# Patient Record
Sex: Female | Born: 2000 | Hispanic: No | Marital: Single | Smoking: Never smoker
Health system: Southern US, Community
[De-identification: ages and names within clinical notes are randomized; demographics above are authoritative.]

---
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
## 2021-02-23 ENCOUNTER — Other Ambulatory Visit: Payer: Self-pay | Admitting: Gastroenterology

## 2021-02-23 DIAGNOSIS — T189XXD Foreign body of alimentary tract, part unspecified, subsequent encounter: Secondary | ICD-10-CM

## 2021-02-23 DIAGNOSIS — T189XXA Foreign body of alimentary tract, part unspecified, initial encounter: Secondary | ICD-10-CM

## 2022-02-08 ENCOUNTER — Encounter: Payer: Self-pay | Admitting: Internal Medicine

## 2022-02-08 ENCOUNTER — Other Ambulatory Visit: Payer: Self-pay

## 2022-02-08 ENCOUNTER — Ambulatory Visit (INDEPENDENT_AMBULATORY_CARE_PROVIDER_SITE_OTHER): Payer: Medicaid Other | Admitting: Internal Medicine

## 2022-02-08 ENCOUNTER — Other Ambulatory Visit (HOSPITAL_COMMUNITY)
Admission: RE | Admit: 2022-02-08 | Discharge: 2022-02-08 | Disposition: A | Discharge status: Home / Self Care | Payer: Medicaid Other | Source: Ambulatory Visit | Attending: Internal Medicine | Admitting: Internal Medicine

## 2022-02-08 VITALS — BP 100/68 | HR 74 | Temp 97.9°F | Ht 62.5 in | Wt 107.1 lb

## 2022-02-08 DIAGNOSIS — Z13220 Encounter for screening for lipoid disorders: Secondary | ICD-10-CM | POA: Diagnosis not present

## 2022-02-08 DIAGNOSIS — Z202 Contact with and (suspected) exposure to infections with a predominantly sexual mode of transmission: Secondary | ICD-10-CM

## 2022-02-08 DIAGNOSIS — Z113 Encounter for screening for infections with a predominantly sexual mode of transmission: Secondary | ICD-10-CM | POA: Insufficient documentation

## 2022-02-08 DIAGNOSIS — Z0000 Encounter for general adult medical examination without abnormal findings: Secondary | ICD-10-CM | POA: Insufficient documentation
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
## 2022-02-08 NOTE — Assessment & Plan Note (Signed)
Pt is here to establish care. No acute concerns today.  -Labs: HIV and lipid profile

## 2022-02-08 NOTE — Patient Instructions (Signed)
Thank you, Gina Montes for allowing [place] to provide your care today. Today we discussed your overall health.  I am obtaining labs today and will contact you with these results.    I have ordered the following labs for you:  Lab Orders         HIV antibody (with reflex)         RPR         Lipid Profile     + Self-swab labs  Referrals ordered today:   Referral Orders  No referral(s) requested today     I have ordered the following medication/changed the following medications:   Stop the following medications: There are no discontinued medications.   Start the following medications: No orders of the defined types were placed in this encounter.    Follow up: 1 year    Should you have any questions or concerns please call the internal medicine clinic at [phone].

## 2022-02-08 NOTE — Progress Notes (Signed)
   CC: establishment of care  HPI:  [name] is a 20 y.o. with past medical history as noted below who presents to the clinic today for the establishment of care. Please see problem-based list for further details, assessments, and plans.   No past medical history on file.  Family history: Diabetes in paternal grandfather and father with prediabetes. No cancers or heart attacks.  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 Vapes "every so often throughout the day," about 2 years, no alcohol use, no other drug use currently.    Review of Systems: Negative aside from that listed in individualized problem based charting.   Physical Exam:  Vitals:   02/08/22 1040  BP: 100/68  Pulse: 74  Temp: 97.9 F (36.6 C)  TempSrc: Oral  SpO2: 100%  Weight: 107 lb 1.6 oz (48.6 kg)  Height: 5' 2.5" (1.588 m)   General: NAD, nl appearance HE: Normocephalic, atraumatic, EOMI, Conjunctivae normal ENT: No congestion, no rhinorrhea, no exudate or erythema  Cardiovascular: Normal rate, regular rhythm. No murmurs, rubs, or gallops Pulmonary: Effort normal, breath sounds normal. No wheezes, rales, or rhonchi Abdominal: soft, nontender, bowel sounds present Musculoskeletal: no swelling, deformity, injury or tenderness in extremities Skin: Warm, dry, no bruising, erythema, or rash Psychiatric/Behavioral: normal mood, normal behavior     Assessment & Plan:   See Encounters Tab for problem based charting.  Patient discussed with Dr. Dareen Piano

## 2022-02-09 LAB — CERVICOVAGINAL ANCILLARY ONLY
Bacterial Vaginitis (gardnerella): POSITIVE — AB
Candida Glabrata: NEGATIVE
Candida Vaginitis: POSITIVE — AB
Chlamydia: NEGATIVE
Comment: NEGATIVE
Comment: NEGATIVE
Comment: NEGATIVE
Comment: NEGATIVE
Comment: NEGATIVE
Comment: NORMAL
Neisseria Gonorrhea: NEGATIVE
Trichomonas: NEGATIVE

## 2022-02-09 LAB — RPR: RPR Ser Ql: NONREACTIVE

## 2022-02-09 LAB — LIPID PANEL
Chol/HDL Ratio: 1.9 ratio (ref 0.0–4.4)
Cholesterol, Total: 159 mg/dL (ref 100–199)
HDL: 82 mg/dL (ref >39)
LDL Chol Calc (NIH): 65 mg/dL (ref 0–99)
Triglycerides: 59 mg/dL (ref 0–149)
VLDL Cholesterol Cal: 12 mg/dL (ref 5–40)

## 2022-02-09 LAB — HIV ANTIBODY (ROUTINE TESTING W REFLEX): HIV Screen 4th Generation wRfx: NONREACTIVE

## 2022-02-09 NOTE — Progress Notes (Signed)
Internal Medicine Clinic Attending ° °Case discussed with Dr. Bonanno  °  At the time of the visit.  We reviewed the resident’s history and exam and pertinent patient test results.  I agree with the assessment, diagnosis, and plan of care documented in the resident’s note. ° °

## 2022-02-12 ENCOUNTER — Other Ambulatory Visit: Payer: Self-pay | Admitting: Internal Medicine

## 2022-02-12 MED ORDER — FLUCONAZOLE 150 MG PO TABS: ORAL_TABLET | ORAL | 0 refills | Status: DC

## 2022-02-12 MED ORDER — METRONIDAZOLE 500 MG PO TABS: 500.0000 mg | ORAL_TABLET | Freq: Two times a day (BID) | ORAL | 0 refills | Status: AC

## 2022-02-12 NOTE — Addendum Note (Signed)
Addended by: Orvis Brill on: 02/12/2022 02:41 PM   Modules accepted: Orders

## 2022-05-05 ENCOUNTER — Emergency Department: Payer: Medicaid Other

## 2022-05-05 ENCOUNTER — Other Ambulatory Visit: Payer: Self-pay

## 2022-05-05 ENCOUNTER — Emergency Department
Admission: EM | Admit: 2022-05-05 | Discharge: 2022-05-05 | Disposition: A | Discharge status: Home / Self Care | Payer: Medicaid Other | Attending: Emergency Medicine | Admitting: Emergency Medicine

## 2022-05-05 DIAGNOSIS — M542 Cervicalgia: Secondary | ICD-10-CM | POA: Diagnosis not present

## 2022-05-05 DIAGNOSIS — S161XXA Strain of muscle, fascia and tendon at neck level, initial encounter: Secondary | ICD-10-CM | POA: Diagnosis not present

## 2022-05-05 DIAGNOSIS — M545 Low back pain, unspecified: Secondary | ICD-10-CM | POA: Diagnosis not present

## 2022-05-05 DIAGNOSIS — S39012A Strain of muscle, fascia and tendon of lower back, initial encounter: Secondary | ICD-10-CM | POA: Diagnosis not present

## 2022-05-05 DIAGNOSIS — Y92410 Unspecified street and highway as the place of occurrence of the external cause: Secondary | ICD-10-CM | POA: Insufficient documentation

## 2022-05-05 DIAGNOSIS — S199XXA Unspecified injury of neck, initial encounter: Secondary | ICD-10-CM | POA: Diagnosis not present

## 2022-05-05 DIAGNOSIS — M546 Pain in thoracic spine: Secondary | ICD-10-CM | POA: Diagnosis not present

## 2022-05-05 LAB — POC URINE PREG, ED: Preg Test, Ur: NEGATIVE

## 2022-05-05 MED ORDER — MELOXICAM 15 MG PO TABS: 15.0000 mg | ORAL_TABLET | Freq: Every day | ORAL | 0 refills | Status: AC

## 2022-05-05 MED ORDER — METHOCARBAMOL 500 MG PO TABS: 500.0000 mg | ORAL_TABLET | Freq: Four times a day (QID) | ORAL | 0 refills | Status: DC

## 2022-05-05 NOTE — ED Notes (Addendum)
Pt in a c-collar on arrival. Dr. Jacqualine Code EDP cleared for triage.
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
## 2022-05-05 NOTE — ED Triage Notes (Signed)
See first nurse note.  Pt awake and alert with c-collar in place. Pt is A&Ox4. No LOC noted.

## 2022-05-05 NOTE — ED Notes (Signed)
C-collar removed by Betha Loa PA-C. Patient was given gown and instructed to undress. Family at bedside.
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
## 2024-02-10 ENCOUNTER — Other Ambulatory Visit (HOSPITAL_COMMUNITY)
Admission: RE | Admit: 2024-02-10 | Discharge: 2024-02-10 | Disposition: A | Discharge status: Home / Self Care | Source: Ambulatory Visit | Attending: Internal Medicine | Admitting: Internal Medicine

## 2024-02-10 ENCOUNTER — Ambulatory Visit: Payer: Self-pay | Admitting: Student

## 2024-02-10 VITALS — BP 119/65 | HR 68 | Temp 98.3°F | Ht 62.0 in | Wt 121.0 lb

## 2024-02-10 DIAGNOSIS — Z113 Encounter for screening for infections with a predominantly sexual mode of transmission: Secondary | ICD-10-CM

## 2024-02-10 DIAGNOSIS — Z3202 Encounter for pregnancy test, result negative: Secondary | ICD-10-CM

## 2024-02-10 DIAGNOSIS — L739 Follicular disorder, unspecified: Secondary | ICD-10-CM

## 2024-02-10 DIAGNOSIS — R195 Other fecal abnormalities: Secondary | ICD-10-CM | POA: Diagnosis not present

## 2024-02-10 LAB — POCT URINE PREGNANCY: Preg Test, Ur: NEGATIVE

## 2024-02-10 NOTE — Patient Instructions (Signed)
 Thank you, Ms.Lanisha [place] for allowing us  to provide your care today. Today we discussed STI screening, birth control options, abdominal rash, and loose stools.   I have ordered the following labs for you:  Lab Orders         POCT Urine Pregnancy      Tests ordered today:  Cervicovaginal swab   Referrals ordered today:   Referral Orders  No referral(s) requested today     I have ordered the following medication/changed the following medications:   Stop the following medications: There are no discontinued medications.   Start the following medications: No orders of the defined types were placed in this encounter.    Follow up: 1 year or earlier as needed, can call back clinic to follow up on today's visit if things change   Remember:   - I will call you with the results of your urine pregnancy test and vaginal swab. I will send over prescriptions if needed. Please remember that abstinence or condoms/barrier protection are ways to prevent sexually transmitted infections. If you become interested in starting any birth control please call our clinic back and we can discuss your options further including pill, injection, implant, IUD, or ring.   - Your rash is likely due to shaving. Folliculitis is an inflammation of the hair follicles. It is easy for bacteria from the skin to enter into the hair follicle. Your is a mild case which we can treat by avoiding shaving (use Nair or wax instead once the rash improves/isn't raised), washing with soap and water, avoiding any harsh products with fragrances, and using sunblock if out in the sun to avoid any darkening of the spots (we call this darker skin post inflammatory hyperpigmentation). If you notice any worsening or no improvement despite avoiding shaving we can send in a topical antibiotic to your pharmacy that you can use for 5-7 days.   Should you have any questions or concerns please call the internal medicine clinic at  [phone].     Ryle Buscemi Arellano Zameza, MD PGY-1 Internal Medicine Teaching Progam [facility] Center

## 2024-02-10 NOTE — Progress Notes (Signed)
 Established Patient Office Visit  Subjective   Patient ID: Gina Montes, female    DOB: 09-29-2000  Age: 23 y.o. MRN: [id number]  Chief Complaint  Patient presents with   Rash    Patient is a 23 y.o. with a past medical history stated below who presents today for annual physical and she'd like to get help with a new rash on her abdomen. Her last visit to [facility] was on 02/08/2022. Please see problem based assessment and plan for additional details.      No past medical history on file.    Review of Systems  Constitutional:  Negative for fever.  Skin:  Positive for rash.     Objective:     BP 119/65 (BP Location: Right Arm, Patient Position: Sitting, Cuff Size: Small)   Pulse 68   Temp 98.3 F (36.8 C) (Oral)   Ht 5' 2 (1.575 m)   Wt 121 lb (54.9 kg)   SpO2 100%   BMI 22.13 kg/m  Wt Readings from Last 3 Encounters:  02/10/24 121 lb (54.9 kg)  02/08/22 107 lb 1.6 oz (48.6 kg)  12/29/19 120 lb (54.4 kg) (41%, Z= -0.23)*   * Growth percentiles are based on CDC (Girls, 2-20 Years) data.      Physical Exam HENT:     Head: Normocephalic and atraumatic.   Cardiovascular:     Rate and Rhythm: Normal rate and regular rhythm.     Pulses: Normal pulses.     Heart sounds: Normal heart sounds.  Pulmonary:     Effort: Pulmonary effort is normal.     Breath sounds: Normal breath sounds.  Abdominal:     General: Abdomen is flat.     Palpations: Abdomen is soft.     Tenderness: There is no abdominal tenderness.   Musculoskeletal:        General: Normal range of motion.   Skin:    General: Skin is warm.     Comments: Please see image below for photo of rash on abdomen. Affected area is widespread across upper abdomen, looks to have small erythematous papules with some surrounding post inflammatory hyperpigmentation. Surrounding skin appears to have been recently shaved.    Neurological:     General: No focal deficit present.     Mental Status: She is alert.    Psychiatric:        Mood and Affect: Mood normal.      Results for orders placed or performed in visit on 02/10/24  POCT Urine Pregnancy  Result Value Ref Range   Preg Test, Ur Negative Negative  Cervicovaginal ancillary only  Result Value Ref Range   Neisseria Gonorrhea Negative    Chlamydia Negative    Trichomonas Negative    Bacterial Vaginitis (gardnerella) Negative    Candida Vaginitis Negative    Candida Glabrata Negative    Comment      Normal Reference Range Bacterial Vaginosis - Negative   Comment Normal Reference Range Candida Species - Negative    Comment Normal Reference Range Candida Galbrata - Negative    Comment Normal Reference Range Trichomonas - Negative    Comment Normal Reference Ranger Chlamydia - Negative    Comment      Normal Reference Range Neisseria Gonorrhea - Negative    Last hemoglobin A1c No results found for: HGBA1C    The ASCVD Risk score (Arnett DK, et al., 2019) failed to calculate for the following reasons:   The 2019 ASCVD risk score is  only valid for ages 50 to 38    Assessment & Plan:   Problem List Items Addressed This Visit     Routine screening for STI (sexually transmitted infection) - Primary   Patient with new intimate partner. Not on any formal birth control, uses condoms and pull out method mostly. Has good insight to risks of these methods for birth control and to stay safe from sexually transmitted infections. Last menstrual period a month ago, normal. No symptoms of dysuria, discharge, itch, odor, or pain but would like to get pregnancy test and STI screening. Counseled patient on various birth control methods, states she is not interested in any at the moment but knows she can request more information or an appointment to start birth control at any time.  Plan - Cervicovaginal swab for STI screening- negative today, no further treatment needed - Urine pregnancy test- negative today       Relevant Orders    Cervicovaginal ancillary only (Completed)   POCT Urine Pregnancy (Completed)   Folliculitis   Patient states her rash started a couple of days ago on her abdomen. Skin seems dry and patchy but denies any pain, pruritus, discharge, or bleeding. No new lotions, clothing, or laundry detergent. Denies any fever, nausea, or vomiting. No sick contacts but does endorse recent loose stools that started about 2 weeks ago. Has felt well overall. On exam her rash appears to be consistent with folliculitis as it is over recently shaved skin and appears to only affect areas where the follicle is visible. Discussed conservative management including cleaning the affected area with mild soap and water, drying it well, wearing looser cotton clothing, changing out of sweaty clothing, and avoiding further shaving. Patient tried this for 4 days then called back the clinic to say there was minimal improvement and she is concerned a larger area is involved. Patient agreeable to applying topical antibiotic for 5-7 days and calling back if there is any worsening for further assessment and treatment.  Plan - Avoid shaving, wash/dry area with mild soap and water, apply thin coat of clindamycin 1% gel twice daily for 5-7 days - Avoid sun exposure to area to help with post inflammatory hyperpigmentation, will need to wear sunscreen when affected area improves      Loose stools   Patient with 1-2 loose stools a day for last 2 weeks. States they can be foul smelling, appear to have a normal color. Bowel movements don't seem to be any worse with certain foods. Eats a regular diet. Denies sick contacts, fever, nausea, vomiting, abdominal pain, or weight loss. Low suspicion for bacterial gastroenteritis. More likely to be mild viral infection or could be due to lactose intolerance. Patient does not seem to be too bothered by loose stools, recommended over the counter imodium. Can call clinic if episodes become more frequent or she  develops additional symptoms.       Patient discussed with Dr. Lelia Putnam.  Return as needed, for rash, loose stools, or to discuss birth control methods.   Gina Lofquist Arellano Zameza, MD PGY-1 [facility]

## 2024-02-11 ENCOUNTER — Ambulatory Visit: Payer: Self-pay | Admitting: Student

## 2024-02-11 LAB — CERVICOVAGINAL ANCILLARY ONLY
Bacterial Vaginitis (gardnerella): NEGATIVE
Candida Glabrata: NEGATIVE
Candida Vaginitis: NEGATIVE
Chlamydia: NEGATIVE
Comment: NEGATIVE
Comment: NEGATIVE
Comment: NEGATIVE
Comment: NEGATIVE
Comment: NEGATIVE
Comment: NORMAL
Neisseria Gonorrhea: NEGATIVE
Trichomonas: NEGATIVE

## 2024-02-13 ENCOUNTER — Telehealth: Payer: Self-pay | Admitting: *Deleted

## 2024-02-13 ENCOUNTER — Other Ambulatory Visit: Payer: Self-pay | Admitting: Student

## 2024-02-13 DIAGNOSIS — L739 Follicular disorder, unspecified: Secondary | ICD-10-CM | POA: Insufficient documentation

## 2024-02-13 DIAGNOSIS — R195 Other fecal abnormalities: Secondary | ICD-10-CM | POA: Insufficient documentation

## 2024-02-13 MED ORDER — CLINDAMYCIN PHOS (ONCE-DAILY) 1 % EX GEL: 1.0000 | Freq: Two times a day (BID) | CUTANEOUS | 0 refills | Status: DC

## 2024-02-13 NOTE — Telephone Encounter (Signed)
 Copied from CRM [id number]. Topic: General - Other >> Feb 13, 2024  2:27 PM Carrielelia G wrote: Reason for CRM: Patient was instructed by provider that if she wanted a topical RX  for her rash to please call. Patient said she would like it please.  Please advise

## 2024-02-13 NOTE — Assessment & Plan Note (Signed)
 Patient with new intimate partner. Not on any formal birth control, uses condoms and pull out method mostly. Has good insight to risks of these methods for birth control and to stay safe from sexually transmitted infections. Last menstrual period a month ago, normal. No symptoms of dysuria, discharge, itch, odor, or pain but would like to get pregnancy test and STI screening. Counseled patient on various birth control methods, states she is not interested in any at the moment but knows she can request more information or an appointment to start birth control at any time.  Plan - Cervicovaginal swab for STI screening- negative today, no further treatment needed - Urine pregnancy test- negative today

## 2024-02-13 NOTE — Assessment & Plan Note (Signed)
 Patient states her rash started a couple of days ago on her abdomen. Skin seems dry and patchy but denies any pain, pruritus, discharge, or bleeding. No new lotions, clothing, or laundry detergent. Denies any fever, nausea, or vomiting. No sick contacts but does endorse recent loose stools that started about 2 weeks ago. Has felt well overall. On exam her rash appears to be consistent with folliculitis as it is over recently shaved skin and appears to only affect areas where the follicle is visible. Discussed conservative management including cleaning the affected area with mild soap and water, drying it well, wearing looser cotton clothing, changing out of sweaty clothing, and avoiding further shaving. Patient tried this for 4 days then called back the clinic to say there was minimal improvement and she is concerned a larger area is involved. Patient agreeable to applying topical antibiotic for 5-7 days and calling back if there is any worsening for further assessment and treatment.  Plan - Avoid shaving, wash/dry area with mild soap and water, apply thin coat of clindamycin 1% gel twice daily for 5-7 days - Avoid sun exposure to area to help with post inflammatory hyperpigmentation, will need to wear sunscreen when affected area improves

## 2024-02-13 NOTE — Assessment & Plan Note (Signed)
 Patient with 1-2 loose stools a day for last 2 weeks. States they can be foul smelling, appear to have a normal color. Bowel movements don't seem to be any worse with certain foods. Eats a regular diet. Denies sick contacts, fever, nausea, vomiting, abdominal pain, or weight loss. Low suspicion for bacterial gastroenteritis. More likely to be mild viral infection or could be due to lactose intolerance. Patient does not seem to be too bothered by loose stools, recommended over the counter imodium. Can call clinic if episodes become more frequent or she develops additional symptoms.

## 2024-02-14 NOTE — Progress Notes (Signed)
 Internal Medicine Clinic Attending  Case discussed with the resident at the time of the visit.  We reviewed the resident's history and exam and pertinent patient test results.  I agree with the assessment, diagnosis, and plan of care documented in the resident's note.

## 2024-04-22 DIAGNOSIS — B9689 Other specified bacterial agents as the cause of diseases classified elsewhere: Secondary | ICD-10-CM | POA: Diagnosis not present

## 2024-04-22 DIAGNOSIS — R21 Rash and other nonspecific skin eruption: Secondary | ICD-10-CM | POA: Diagnosis not present

## 2024-04-22 DIAGNOSIS — N760 Acute vaginitis: Secondary | ICD-10-CM | POA: Diagnosis not present

## 2024-05-04 ENCOUNTER — Telehealth: Payer: Self-pay | Admitting: Student

## 2024-05-04 DIAGNOSIS — B3731 Acute candidiasis of vulva and vagina: Secondary | ICD-10-CM | POA: Diagnosis not present

## 2024-05-04 DIAGNOSIS — N898 Other specified noninflammatory disorders of vagina: Secondary | ICD-10-CM | POA: Diagnosis not present

## 2024-05-04 NOTE — Telephone Encounter (Signed)
 Spoke with the patient who was seen by another office already.  Copied from CRM [id number]. Topic: Appointments - Scheduling Inquiry for Clinic >> May 04, 2024 11:04 AM Alfonso ORN wrote: Reason for CRM: patient need a ER followup , having abnormal discharge that is green and have some itching some discomfort going on for about 2 weeks  was given a medication call that is not helping  Requesting an appointment today or tomorrow    05/04/24 or 05/05/24    ----------------------------------------------------------------------- From previous Reason for Contact - Scheduling: Patient/patient representative is calling to schedule an appointment. Refer to attachments for appointment information.

## 2024-07-09 ENCOUNTER — Ambulatory Visit

## 2024-07-09 ENCOUNTER — Ambulatory Visit: Payer: Self-pay

## 2024-07-09 ENCOUNTER — Other Ambulatory Visit (HOSPITAL_COMMUNITY): Admission: RE | Admit: 2024-07-09 | Discharge: 2024-07-09 | Disposition: A | Source: Ambulatory Visit

## 2024-07-09 VITALS — BP 108/63 | HR 68 | Temp 98.1°F | Ht 62.0 in | Wt 131.0 lb

## 2024-07-09 DIAGNOSIS — F17210 Nicotine dependence, cigarettes, uncomplicated: Secondary | ICD-10-CM | POA: Diagnosis not present

## 2024-07-09 DIAGNOSIS — Z8619 Personal history of other infectious and parasitic diseases: Secondary | ICD-10-CM | POA: Diagnosis not present

## 2024-07-09 DIAGNOSIS — F17290 Nicotine dependence, other tobacco product, uncomplicated: Secondary | ICD-10-CM

## 2024-07-09 DIAGNOSIS — Z124 Encounter for screening for malignant neoplasm of cervix: Secondary | ICD-10-CM | POA: Insufficient documentation

## 2024-07-09 DIAGNOSIS — A64 Unspecified sexually transmitted disease: Secondary | ICD-10-CM | POA: Insufficient documentation

## 2024-07-09 DIAGNOSIS — Z8742 Personal history of other diseases of the female genital tract: Secondary | ICD-10-CM

## 2024-07-09 DIAGNOSIS — R300 Dysuria: Secondary | ICD-10-CM | POA: Diagnosis not present

## 2024-07-09 MED ORDER — BORIC ACID 600 MG VA SUPP: 600.0000 mg | Freq: Every day | VAGINAL | 0 refills | Status: DC

## 2024-07-09 NOTE — Progress Notes (Signed)
 CC: Vaginal discharge, acute visit  HPI:  [name] is a 23 y.o. female with pertinent past medical history stated below and presents today for vaginal discharge, acute visit. Please see problem based assessment and plan for additional details.  Last clinic appointment: 02/10/2024 for routine screening of STI.  Was not interested in birth control at the moment.  Was negative for trichomonas, yeast, chlamydia, gonorrhea, syphilis, pregnancy  Last Pertinent Labs Documented:      No data to display              No data to display          No results found for: HGBA1C   No past medical history on file.  Current Outpatient Medications on File Prior to Visit  Medication Sig Dispense Refill   amitriptyline  (ELAVIL ) 25 MG tablet Take 1 tablet nightly p.o. (Patient not taking: Reported on 12/29/2019) 30 tablet 3   aspirin-acetaminophen -caffeine (EXCEDRIN MIGRAINE) 250-250-65 MG tablet Take 1-2 tablets by mouth every 6 (six) hours as needed for headache (or pain).     Clindamycin  Phos, Once-Daily, (CLINDAGEL) 1 % GEL Apply 1 Application topically in the morning and at bedtime. Apply thin layer to affected area after cleaning skin with soap and water twice daily for 5-7 days. 75 mL 0   fluconazole  (DIFLUCAN ) 150 MG tablet TAKE FIRST TABLET ON DAY 1, TAKE SECOND TABLET ON DAY 4. (Patient not taking: Reported on 05/05/2022) 2 tablet 0   methocarbamol  (ROBAXIN ) 500 MG tablet Take 1 tablet (500 mg total) by mouth 4 (four) times daily. 16 tablet 0   No current facility-administered medications on file prior to visit.    Family History  Problem Relation Age of Onset   Migraines Maternal Grandmother    Seizures Neg Hx    Autism Neg Hx    ADD / ADHD Neg Hx    Anxiety disorder Neg Hx    Depression Neg Hx    Bipolar disorder Neg Hx    Schizophrenia Neg Hx     Social History   Socioeconomic History   Marital status: Single    Spouse name: Not on file   Number of children: Not  on file   Years of education: Not on file   Highest education level: Not on file  Occupational History   Not on file  Tobacco Use   Smoking status: Never   Smokeless tobacco: Never  Substance and Sexual Activity   Alcohol use: Not on file   Drug use: Not on file   Sexual activity: Not on file  Other Topics Concern   Not on file  Social History Narrative   Lives with mom, dad and siblings. She is in the 11th grade at [employer].    Social Drivers of Corporate Investment Banker Strain: Not on file  Food Insecurity: Not on file  Transportation Needs: Not on file  Physical Activity: Not on file  Stress: Not on file  Social Connections: Not on file  Intimate Partner Violence: Not on file    Review of Systems: As stated below  Vitals:   07/09/24 1432  BP: 108/63  Pulse: 68  Temp: 98.1 F (36.7 C)  TempSrc: Oral  SpO2: 100%  Weight: 131 lb (59.4 kg)  Height: 5' 2 (1.575 m)    Physical Exam: Physical Exam Exam conducted with a chaperone present.  Constitutional:      General: She is not in acute distress.    Appearance: She  is normal weight. She is not ill-appearing, toxic-appearing or diaphoretic.  Pulmonary:     Effort: Pulmonary effort is normal.  Genitourinary:    General: Normal vulva.     Pubic Area: Rash present.     Labia:        Right: No rash, tenderness or lesion.        Left: No rash, tenderness or lesion.      Vagina: No signs of injury. Vaginal discharge (Thick, white) present. No tenderness or lesions.     Cervix: Normal. No friability or erythema.      Comments: excoriations present Skin:    General: Skin is warm and dry.  Neurological:     Mental Status: She is alert.  Psychiatric:        Mood and Affect: Mood normal.        Behavior: Behavior normal.      Assessment & Plan:   Patient seen with Dr. Shawn Assessment & Plan STI (sexually transmitted infection) Patient presents with symptoms of dysuria, discharge, vaginal itching, odor,  vaginal pain.  Stated that in August, she thought she had a yeast infection so she went to the urgent care however they diagnosed her with BV and she completed treatment of metronidazole ; patient additionally did Monistat OTC, however her symptoms did not resolve.  With continued symptoms, she went to [facility] who diagnosed her with a yeast infection and prescribed Diflucan  which the patient required both pills and still did not have resolution of symptoms.  She went back to [facility] where they tested for specific strain of candidal infection.  By this point vaginal discharge changed from creamy white to green.  They prescribed 2 creams for outside and intravaginal medicine.  After she completed this treatment, she was symptom-free for about a week.  By mid October she started experiencing symptoms of a UTI (see below) and had the return of increased vaginal discharge.  At this point, patient said vaginal discharge was alternating between creamy, thick, white and watery, white, with foul odor of spoiled milk or bleach.  As of this appointment, patient stated that there is no foul odor with her discharge but her vulva is inflamed/swollen.  She is on the last day of her menstrual period.  This past week she has not used tampons, only pads.  Her sexual history is as follows:  Number of current sexual partners: 1 (same partner from last clinic visit where she tested negative for STIs) Sexual orientation: heterosexual  Contraceptive: not on contraceptives. She has discussed with her partner and he will possibly get a vasectomy.  Has no personal history of blood clots or migraines with aura.  She currently smokes occasionally (vaping/cigarettes).  Counseled patient on options for birth control including IUD, shot, birth control pills, implant. Not interested in contraceptives at this time.   Condoms: Not using condoms Pregnancy intention: not opposed to getting pregnant Safety: Patient  stated that she feels safe in her current relationship  Patient's partner did not get treated for BV when she resulted positive at the urgent care.  It is likely that she has been reinfected with BV and positive for candidal infection.  On physical exam there were excoriations on external GU.  Vaginal canal appeared normal, cervix appeared normal.  Discharge was present (thick, white).  Will treat with boric acid vaginal suppositories for 14 days to cover candidal infection and BV while awaiting for other results.  See below for the remainder of the plan.  -  Cervicovaginal ancillary swab collected today - Instructed patient to have partner tested and treated based on previous positive BV results from urgent care on 04/22/2024 to prevent reinfection - Counseled patient's on importance of abstinence/use of condoms to prevent re-infection before completing/partner completing treatment. - Prescribed boric acid vaginal suppositories for 14 days. Patient counseled on how to take this medication  - Instructed to apply over-the-counter hydrocortisone cream over the area of itchiness twice a day as needed - Instructed patient to continue keeping the area of itchiness clean and to apply Aquaphor only after she has applied her hydrocortisone for good penetration of the topical Cervical cancer screening Patient has never had a Pap smear done before.  Explained importance of Pap smear for screening of cervical cancers.  Patient stated understanding and willingness to have Pap smear done along with vaginal swab. -Cytology Pap smear ordered with reflex to HPV and testing for herpes 1 and 2 Dysuria Patient has been complaining of off-and-on dysuria, frequency, suprapubic pressure since mid October 2025.  Stated that she has never had a UTI before, but her roommate gets them frequently.  The symptoms are roommate described where things that she was feeling.  She did not go get treatment as the symptoms would wax and  wane.  Her last symptoms of frequency, dysuria, suprapubic pressure occurred 2 days ago.  Will obtain a urine specimen for urinalysis, however will not treat if patient is asymptomatic at time of results.  Explained the above to the patient who stated understanding. -UA ordered   Orders Placed This Encounter  Procedures   Urinalysis, Reflex Microscopic     Maston Wight, D.O. [facility], PGY-1 Date 07/09/2024 Time 5:24 PM

## 2024-07-09 NOTE — Telephone Encounter (Addendum)
 FYI Only or Action Required?: FYI only for provider: appointment scheduled on 07/09/24.  Patient was last seen in primary care on 02/10/2024 by Arellano Zameza, Priscila, MD.  Called Nurse Triage reporting Dysuria and Vaginitis.  Symptoms began several months ago.  Interventions attempted: Prescription medications: monistat, BV treatment.  Symptoms are: unchanged.  Triage Disposition: See Physician Within 24 Hours  Patient/caregiver understands and will follow disposition?: Yes        Copied from CRM [id number]. Topic: Clinical - Red Word Triage >> Jul 09, 2024  9:52 AM Mercer PEDLAR wrote: Red Word that prompted transfer to Nurse Triage: burning with urination, Irritation, swelling, and odor. Reason for Disposition  Unusual vaginal discharge (e.g., bad smelling, yellow, green, or foamy-white)  Answer Assessment - Initial Assessment Questions 1. SEVERITY: How bad is the pain?  (e.g., Scale 1-10; mild, moderate, or severe)     Uncomfortable 2. FREQUENCY: How many times have you had painful urination today?      *No Answer* 3. PATTERN: Is pain present every time you urinate or just sometimes?      sometimes 4. ONSET: When did the painful urination start?      A while - a few months 5. FEVER: Do you have a fever? If Yes, ask: What is your temperature, how was it measured, and when did it start?     denies 6. PAST UTI: Have you had a urine infection before? If Yes, ask: When was the last time? and What happened that time?      denies 7. CAUSE: What do you think is causing the painful urination?  (e.g., UTI, scratch, Herpes sore)     UTI 8. OTHER SYMPTOMS: Do you have any other symptoms? (e.g., blood in urine, flank pain, genital sores, urgency, vaginal discharge)     Odor, swelling 9. PREGNANCY: Is there any chance you are pregnant? When was your last menstrual period?     *No Answer*  Protocols used: Urination Pain - Female-A-AH

## 2024-07-09 NOTE — Telephone Encounter (Signed)
 Pt has an appt with Dr Benuel today @ 1415PM.

## 2024-07-09 NOTE — Patient Instructions (Addendum)
 Today we discussed the following medical conditions and plan:   - Have your partner go to the doctor and get treated for BV (recommendation is oral metronidazole  (400 mg twice daily for 7 days)).  - ill message/call you with the results of the pap smear, STI testing, and urinalysis  - We are sending in for boric acid vaginal suppositories -- you'll insert ONE tablet INTRAVAGINALLY once daily for 14 days. DO NOT EAT/SWALLOW - apply over-the-counter hydrocortisone cream over the area of itchiness twice a day as needed - continue keeping the area of itchiness clean and to apply Aquaphor only after she has applied her hydrocortisone for good penetration of the topical - abstain from sex/use condoms until we have the results of the testing AND until your partner has been treated.   We look forward to seeing you next time. Please call our clinic at [phone] if you have any questions or concerns. The best time to call is Monday-Friday from 9am-4pm, but there is someone available 24/7. If you need medication refills, please notify your pharmacy one week in advance and they will send us  a request.   Thank you for trusting me with your care. Wishing you the best!   Sallyanne Primas, DO  [facility]

## 2024-07-10 ENCOUNTER — Ambulatory Visit: Payer: Self-pay

## 2024-07-10 LAB — CERVICOVAGINAL ANCILLARY ONLY
Bacterial Vaginitis (gardnerella): POSITIVE — AB
Candida Glabrata: NEGATIVE
Candida Vaginitis: POSITIVE — AB
Chlamydia: NEGATIVE
Comment: NEGATIVE
Comment: NEGATIVE
Comment: NEGATIVE
Comment: NEGATIVE
Comment: NEGATIVE
Comment: NORMAL
Neisseria Gonorrhea: NEGATIVE
Trichomonas: NEGATIVE

## 2024-07-10 LAB — MICROSCOPIC EXAMINATION: Casts: NONE SEEN /LPF

## 2024-07-10 LAB — URINALYSIS, ROUTINE W REFLEX MICROSCOPIC
Bilirubin, UA: NEGATIVE
Glucose, UA: NEGATIVE
Ketones, UA: NEGATIVE
Nitrite, UA: NEGATIVE
Protein,UA: NEGATIVE
RBC, UA: NEGATIVE
Specific Gravity, UA: 1.029 (ref 1.005–1.030)
Urobilinogen, Ur: 0.2 mg/dL (ref 0.2–1.0)
pH, UA: 5.5 (ref 5.0–7.5)

## 2024-07-10 MED ORDER — METRONIDAZOLE 500 MG PO TABS: 500.0000 mg | ORAL_TABLET | Freq: Two times a day (BID) | ORAL | 0 refills | Status: AC

## 2024-07-10 MED ORDER — FLUCONAZOLE 150 MG PO TABS: 150.0000 mg | ORAL_TABLET | Freq: Every day | ORAL | 0 refills | Status: DC

## 2024-07-10 NOTE — Telephone Encounter (Signed)
 Spoke with patient on phone regarding STI testing results. Patient stated symptoms have improved with boric acid. She had no further questions or concerns. I placed instructions within mychart message for prescriptions sent to her preferred pharmacy.

## 2024-07-10 NOTE — Addendum Note (Signed)
 Addended by: SHAWN SICK on: 07/10/2024 08:52 AM   Modules accepted: Level of Service

## 2024-07-10 NOTE — Progress Notes (Signed)
 Internal Medicine Clinic Attending  I was physically present during the key portions of the resident provided service and participated in the medical decision making of patient's management care. I reviewed pertinent patient test results.  The assessment, diagnosis, and plan were formulated together and I agree with the documentation in the resident's note.  Shawn Sick, MD

## 2024-07-15 LAB — CYTOLOGY - PAP
Comment: NEGATIVE
Diagnosis: NEGATIVE
HSV1: NEGATIVE
HSV2: NEGATIVE

## 2024-08-06 ENCOUNTER — Emergency Department (HOSPITAL_COMMUNITY): Admission: EM | Admit: 2024-08-06 | Discharge: 2024-08-06 | Disposition: A | Discharge status: Home / Self Care

## 2024-08-06 ENCOUNTER — Other Ambulatory Visit: Payer: Self-pay

## 2024-08-06 ENCOUNTER — Encounter (HOSPITAL_COMMUNITY): Payer: Self-pay

## 2024-08-06 DIAGNOSIS — R109 Unspecified abdominal pain: Secondary | ICD-10-CM | POA: Diagnosis not present

## 2024-08-06 DIAGNOSIS — K625 Hemorrhage of anus and rectum: Secondary | ICD-10-CM | POA: Insufficient documentation

## 2024-08-06 LAB — CBC WITH DIFFERENTIAL/PLATELET
Abs Immature Granulocytes: 0.01 K/uL (ref 0.00–0.07)
Basophils Absolute: 0 K/uL (ref 0.0–0.1)
Basophils Relative: 0 %
Eosinophils Absolute: 0 K/uL (ref 0.0–0.5)
Eosinophils Relative: 0 %
HCT: 39 % (ref 36.0–46.0)
Hemoglobin: 13 g/dL (ref 12.0–15.0)
Immature Granulocytes: 0 %
Lymphocytes Relative: 24 %
Lymphs Abs: 1.1 K/uL (ref 0.7–4.0)
MCH: 27.5 pg (ref 26.0–34.0)
MCHC: 33.3 g/dL (ref 30.0–36.0)
MCV: 82.6 fL (ref 80.0–100.0)
Monocytes Absolute: 0.6 K/uL (ref 0.1–1.0)
Monocytes Relative: 13 %
Neutro Abs: 2.8 K/uL (ref 1.7–7.7)
Neutrophils Relative %: 63 %
Platelets: 200 K/uL (ref 150–400)
RBC: 4.72 MIL/uL (ref 3.87–5.11)
RDW: 15 % (ref 11.5–15.5)
WBC: 4.5 K/uL (ref 4.0–10.5)
nRBC: 0 % (ref 0.0–0.2)

## 2024-08-06 LAB — BASIC METABOLIC PANEL WITH GFR
Anion gap: 9 (ref 5–15)
BUN: 14 mg/dL (ref 6–20)
CO2: 25 mmol/L (ref 22–32)
Calcium: 9.1 mg/dL (ref 8.9–10.3)
Chloride: 101 mmol/L (ref 98–111)
Creatinine, Ser: 0.51 mg/dL (ref 0.44–1.00)
GFR, Estimated: >60 mL/min (ref >60)
Glucose, Bld: 87 mg/dL (ref 70–99)
Potassium: 3.9 mmol/L (ref 3.5–5.1)
Sodium: 135 mmol/L (ref 135–145)

## 2024-08-06 LAB — URINALYSIS, ROUTINE W REFLEX MICROSCOPIC
Bacteria, UA: NONE SEEN
Bilirubin Urine: NEGATIVE
Glucose, UA: NEGATIVE mg/dL
Hgb urine dipstick: NEGATIVE
Ketones, ur: NEGATIVE mg/dL
Leukocytes,Ua: NEGATIVE
Nitrite: NEGATIVE
Protein, ur: NEGATIVE mg/dL
Specific Gravity, Urine: 1.02 (ref 1.005–1.030)
pH: 7 (ref 5.0–8.0)

## 2024-08-06 LAB — PREGNANCY, URINE: Preg Test, Ur: NEGATIVE

## 2024-08-06 NOTE — ED Triage Notes (Addendum)
 Quick triage note: Pt to ED c/o lower abdominal pain that started this morning. Reports noting bright red blood with bowel movement. Denies hx of abdominal sx. NAD

## 2024-08-06 NOTE — ED Provider Triage Note (Signed)
 Emergency Medicine Provider Triage Evaluation Note  Josilynn Losh , a 23 y.o. female  was evaluated in triage.  Pt complains of BRB per rectum this morning with normal appearing stool. Some mild abdominal cramping. Later this morning passed only blood. No pain at rectum. No vaginal bleeding.   Review of Systems  Positive: Rectal bleeding Negative: Lightheadedness,   Physical Exam  BP (!) 142/87 (BP Location: Right Arm)   Pulse 90   Temp 98.6 F (37 C)   Resp 16   Ht 5' 2 (1.575 m)   Wt 54.4 kg   SpO2 98%   BMI 21.95 kg/m  Gen:   Awake, no distress   Resp:  Normal effort  MSK:   Moves extremities without difficulty  Other:    Medical Decision Making  Medically screening exam initiated at 12:43 PM.  Appropriate orders placed.  Wilhelmina Hark was informed that the remainder of the evaluation will be completed by another provider, this initial triage assessment does not replace that evaluation, and the importance of remaining in the ED until their evaluation is complete.     Odell Balls, PA-C 08/06/24 1244

## 2024-08-06 NOTE — ED Provider Notes (Signed)
 [place] EMERGENCY DEPARTMENT AT [facility] Provider Note   CSN: [id number] Arrival date & time: 08/06/24  1112     Patient presents with: Abdominal Pain   Gina Montes is a 23 y.o. female.   23 year old female with no reported past medical history presenting to the emergency department today with concern for a moderate amount of blood when she wiped after having a bowel movement earlier today.  The patient states that she has noted a small amount of blood when wiping in the past.  She states that today that she noticed a moderate amount of blood.  There was no blood mixed in with the stool.  She denies any dark tarry stools.  Denies any nausea or vomiting.  The patient states that she was feeling some bloating today but denies any significant abdominal pain.  She denies any lightheadedness.  Is not on any blood thinners.   Abdominal Pain      Prior to Admission medications  Medication Sig Start Date End Date Taking? Authorizing Provider  amitriptyline  (ELAVIL ) 25 MG tablet Take 1 tablet nightly p.o. Patient not taking: Reported on 12/29/2019 10/06/18   Corinthia Blossom, MD  aspirin-acetaminophen -caffeine (EXCEDRIN MIGRAINE) 250-250-65 MG tablet Take 1-2 tablets by mouth every 6 (six) hours as needed for headache (or pain).    [provider]  Boric Acid 600 MG SUPP Place 600 mg vaginally daily. 07/09/24   Rihner, Emilie, DO  Clindamycin  Phos, Once-Daily, (CLINDAGEL) 1 % GEL Apply 1 Application topically in the morning and at bedtime. Apply thin layer to affected area after cleaning skin with soap and water twice daily for 5-7 days. 02/13/24   Arellano Zameza, Priscila, MD  fluconazole  (DIFLUCAN ) 150 MG tablet Take 1 tablet (150 mg total) by mouth daily. 07/10/24   Rihner, Emilie, DO  methocarbamol  (ROBAXIN ) 500 MG tablet Take 1 tablet (500 mg total) by mouth 4 (four) times daily. 05/05/22   Cuthriell, Dorn BIRCH, PA-C    Allergies: Patient has no known allergies.     Review of Systems  Gastrointestinal:  Positive for abdominal pain.  All other systems reviewed and are negative.   Updated Vital Signs BP 133/83 (BP Location: Right Arm)   Pulse 75   Temp 98.3 F (36.8 C)   Resp 14   Ht 5' 2 (1.575 m)   Wt 54.4 kg   SpO2 100%   BMI 21.95 kg/m   Physical Exam Vitals and nursing note reviewed.   Gen: NAD Eyes: PERRL, EOMI HEENT: no oropharyngeal swelling Neck: trachea midline Resp: clear to auscultation bilaterally Card: RRR, no murmurs, rubs, or gallops Abd: nontender, nondistended Rectal: Chaperoned by female nursing staff shows no obvious external hemorrhoids and no anal fissures.  No gross bleeding noted Extremities: no calf tenderness, no edema Vascular: 2+ radial pulses bilaterally, 2+ DP pulses bilaterally Skin: no rashes Psyc: acting appropriately   (all labs ordered are listed, but only abnormal results are displayed) Labs Reviewed  CBC WITH DIFFERENTIAL/PLATELET  BASIC METABOLIC PANEL WITH GFR  PREGNANCY, URINE  URINALYSIS, ROUTINE W REFLEX MICROSCOPIC    EKG: None  Radiology: No results found.   Procedures   Medications Ordered in the ED - No data to display                                  Medical Decision Making 23 year old female with no reported past medical history presenting to the emergency  department today with some rectal bleeding with wiping.  The patient's labs ordered at triage show a stable hemoglobin.  Patient's vital signs are stable.  I do not appreciate any obvious cause for the bleeding here.  She does not have any significant abdominal tenderness on exam to suggest diverticulitis at this time and denies any blood in her stool.  I think she is stable for outpatient follow-up.  Will have her follow-up with GI as an outpatient.  She is encouraged to start some fiber supplements and is discharged with return precautions.        Final diagnoses:  Rectal bleeding    ED Discharge Orders      None          Ula Prentice SAUNDERS, MD 08/06/24 2306

## 2024-08-06 NOTE — Discharge Instructions (Signed)
 You may start a fiber supplement such as Metamucil daily.  Please call and schedule a follow-up appointment with the GI doctor at the number provided.  Return to the ER if you have significant bleeding such as a lot of blood mixed in your stool, develop abdominal pain, fevers, lightheadedness, or other concerning symptoms.

## 2024-08-24 ENCOUNTER — Encounter (HOSPITAL_COMMUNITY): Payer: Self-pay

## 2024-08-24 ENCOUNTER — Ambulatory Visit (HOSPITAL_COMMUNITY): Admission: EM | Admit: 2024-08-24 | Discharge: 2024-08-24 | Disposition: A | Discharge status: Home / Self Care

## 2024-08-24 DIAGNOSIS — B349 Viral infection, unspecified: Secondary | ICD-10-CM

## 2024-08-24 LAB — POC COVID19/FLU A&B COMBO
Covid Antigen, POC: NEGATIVE
Influenza A Antigen, POC: NEGATIVE
Influenza B Antigen, POC: NEGATIVE

## 2024-08-24 LAB — POCT RAPID STREP A (OFFICE): Rapid Strep A Screen: NEGATIVE

## 2024-08-24 MED ORDER — AZELASTINE HCL 0.1 % NA SOLN: 1.0000 | Freq: Two times a day (BID) | NASAL | 0 refills | Status: AC

## 2024-08-24 MED ORDER — PROMETHAZINE-DM 6.25-15 MG/5ML PO SYRP: 10.0000 mL | ORAL_SOLUTION | Freq: Three times a day (TID) | ORAL | 0 refills | Status: AC | PRN

## 2024-08-24 MED ORDER — IBUPROFEN 600 MG PO TABS: 600.0000 mg | ORAL_TABLET | Freq: Three times a day (TID) | ORAL | 0 refills | Status: AC | PRN

## 2024-08-24 NOTE — ED Provider Notes (Signed)
 " [facility]  Note:  This document was prepared using Dragon voice recognition software and may include unintentional dictation errors.  MRN: [id number] DOB: 2001/05/08  Subjective:   Reagen Goates is a 23 y.o. female presenting for cough, nasal congestion, sneezing, body aches, sore throat, headache x 2 days.  Patient denies taking any over-the-counter medication to treat symptoms prior to arrival in urgent care.  Denies any known sick contacts.  No shortness of breath, chest pain, weakness, dizziness.  Current Medications[1]   Allergies[2]  History reviewed. No pertinent past medical history.   Past Surgical History:  Procedure Laterality Date   ESOPHAGOGASTRODUODENOSCOPY (EGD) WITH PROPOFOL  N/A 12/29/2019   Procedure: ESOPHAGOGASTRODUODENOSCOPY (EGD) WITH PROPOFOL ;  Surgeon: Aneita Gwendlyn DASEN, MD;  Location: [facility] ENDOSCOPY;  Service: Endoscopy;  Laterality: N/A;    Family History  Problem Relation Age of Onset   Migraines Maternal Grandmother    Seizures Neg Hx    Autism Neg Hx    ADD / ADHD Neg Hx    Anxiety disorder Neg Hx    Depression Neg Hx    Bipolar disorder Neg Hx    Schizophrenia Neg Hx     Social History[3]  ROS Refer to HPI for ROS details.  Objective:    Vitals: BP 132/89 (BP Location: Right Arm)   Pulse 78   Temp 97.8 F (36.6 C) (Oral)   Resp 16   LMP 08/11/2024 (Approximate)   SpO2 98%   Physical Exam Vitals and nursing note reviewed.  Constitutional:      General: She is not in acute distress.    Appearance: Normal appearance. She is well-developed. She is not ill-appearing or toxic-appearing.  HENT:     Head: Normocephalic and atraumatic.     Nose: Congestion and rhinorrhea present.     Mouth/Throat:     Mouth: Mucous membranes are moist.     Pharynx: Oropharynx is clear.  Cardiovascular:     Rate and Rhythm: Normal rate.  Pulmonary:     Effort: Pulmonary effort is normal. No respiratory distress.     Breath sounds: No  stridor. No wheezing.  Chest:     Chest wall: No tenderness.  Skin:    General: Skin is warm and dry.  Neurological:     General: No focal deficit present.     Mental Status: She is alert and oriented to person, place, and time.  Psychiatric:        Mood and Affect: Mood normal.        Behavior: Behavior normal.     Procedures  Results for orders placed or performed during the hospital encounter of 08/24/24 (from the past 24 hours)  POC rapid strep A     Status: None   Collection Time: 08/24/24  1:55 PM  Result Value Ref Range   Rapid Strep A Screen Negative Negative  POC Covid19/Flu A&B Antigen     Status: None   Collection Time: 08/24/24  1:59 PM  Result Value Ref Range   Influenza A Antigen, POC Negative Negative   Influenza B Antigen, POC Negative Negative   Covid Antigen, POC Negative Negative    Assessment and Plan :     Discharge Instructions       1. Acute viral syndrome (Primary) - POC Covid19/Flu A&B Antigen complete in UC is negative - POC rapid strep A complete and UC is negative - azelastine (ASTELIN) 0.1 % nasal spray; Place 1 spray into both nostrils 2 (two) times daily.  Use in each nostril as directed  Dispense: 30 mL; Refill: 0 - promethazine -dextromethorphan (PROMETHAZINE -DM) 6.25-15 MG/5ML syrup; Take 10 mLs by mouth 3 (three) times daily as needed for cough.  Dispense: 240 mL; Refill: 0 - ibuprofen  (ADVIL ) 600 MG tablet; Take 1 tablet (600 mg total) by mouth every 8 (eight) hours as needed.  Dispense: 30 tablet; Refill: 0  -Continue to monitor symptoms for any change in severity if there is any escalation of current symptoms or development of new symptoms follow-up in ER for further evaluation and management.      Ceyda Peterka B Adelfa Lozito    [1] No current facility-administered medications for this encounter.  Current Outpatient Medications:    azelastine (ASTELIN) 0.1 % nasal spray, Place 1 spray into both nostrils 2 (two) times daily. Use in  each nostril as directed, Disp: 30 mL, Rfl: 0   ibuprofen  (ADVIL ) 600 MG tablet, Take 1 tablet (600 mg total) by mouth every 8 (eight) hours as needed., Disp: 30 tablet, Rfl: 0   promethazine -dextromethorphan (PROMETHAZINE -DM) 6.25-15 MG/5ML syrup, Take 10 mLs by mouth 3 (three) times daily as needed for cough., Disp: 240 mL, Rfl: 0 [2] No Known Allergies [3]  Social History Tobacco Use   Smoking status: Never    Passive exposure: Never   Smokeless tobacco: Never  Vaping Use   Vaping status: Never Used  Substance Use Topics   Alcohol use: Not Currently   Drug use: Not Currently     Aurea Ethel NOVAK, NP 08/24/24 1417  "

## 2024-08-24 NOTE — ED Triage Notes (Signed)
 Pt c/o cough, congestion, runny nose, sneezing, body aches, sore throat, and headaches x2 days. Denies taken any meds.

## 2024-08-24 NOTE — Discharge Instructions (Addendum)
" °  1. Acute viral syndrome (Primary) - POC Covid19/Flu A&B Antigen complete in UC is negative - POC rapid strep A complete and UC is negative - azelastine (ASTELIN) 0.1 % nasal spray; Place 1 spray into both nostrils 2 (two) times daily. Use in each nostril as directed  Dispense: 30 mL; Refill: 0 - promethazine -dextromethorphan (PROMETHAZINE -DM) 6.25-15 MG/5ML syrup; Take 10 mLs by mouth 3 (three) times daily as needed for cough.  Dispense: 240 mL; Refill: 0 - ibuprofen  (ADVIL ) 600 MG tablet; Take 1 tablet (600 mg total) by mouth every 8 (eight) hours as needed.  Dispense: 30 tablet; Refill: 0  -Continue to monitor symptoms for any change in severity if there is any escalation of current symptoms or development of new symptoms follow-up in ER for further evaluation and management. "
